# Patient Record
Sex: Male | Born: 1960 | Race: White | Hispanic: No | Marital: Married | Smoking: Never smoker
Health system: Southern US, Community
[De-identification: ages and names within clinical notes are randomized; demographics above are authoritative.]

## PROBLEM LIST (undated history)

## (undated) DIAGNOSIS — E119 Type 2 diabetes mellitus without complications: Secondary | ICD-10-CM

## (undated) DIAGNOSIS — E785 Hyperlipidemia, unspecified: Secondary | ICD-10-CM

## (undated) DIAGNOSIS — I878 Other specified disorders of veins: Secondary | ICD-10-CM

## (undated) DIAGNOSIS — I4821 Permanent atrial fibrillation: Secondary | ICD-10-CM

## (undated) DIAGNOSIS — G4733 Obstructive sleep apnea (adult) (pediatric): Secondary | ICD-10-CM

## (undated) DIAGNOSIS — I10 Essential (primary) hypertension: Secondary | ICD-10-CM

## (undated) HISTORY — DX: Essential (primary) hypertension: I10

## (undated) HISTORY — DX: Other specified disorders of veins: I87.8

## (undated) HISTORY — DX: Hyperlipidemia, unspecified: E78.5

## (undated) HISTORY — DX: Obstructive sleep apnea (adult) (pediatric): G47.33

## (undated) HISTORY — DX: Morbid (severe) obesity due to excess calories: E66.01

## (undated) HISTORY — DX: Permanent atrial fibrillation: I48.21

## (undated) HISTORY — DX: Type 2 diabetes mellitus without complications: E11.9

---

## 2008-08-20 ENCOUNTER — Ambulatory Visit: Payer: Self-pay | Admitting: Diagnostic Radiology

## 2008-08-20 ENCOUNTER — Ambulatory Visit (HOSPITAL_BASED_OUTPATIENT_CLINIC_OR_DEPARTMENT_OTHER): Admission: RE | Admit: 2008-08-20 | Discharge: 2008-08-20 | Payer: Self-pay | Admitting: Family Medicine
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
## 2012-10-24 ENCOUNTER — Other Ambulatory Visit: Payer: Self-pay

## 2012-10-24 MED ORDER — METOLAZONE 2.5 MG PO TABS: 2.5000 mg | ORAL_TABLET | ORAL | Status: DC

## 2012-10-24 NOTE — Telephone Encounter (Signed)
Rx was sent to pharmacy electronically via [employer].  

## 2012-11-20 ENCOUNTER — Other Ambulatory Visit: Payer: Self-pay | Admitting: *Deleted

## 2012-11-20 NOTE — Telephone Encounter (Signed)
Rx was sent to pharmacy electronically. [phone], for Metolazone 2.5mg  once weekly and Crestor 10mg  daily

## 2012-11-22 ENCOUNTER — Other Ambulatory Visit: Payer: Self-pay

## 2012-11-22 MED ORDER — METOPROLOL SUCCINATE ER 100 MG PO TB24: 100.0000 mg | ORAL_TABLET | Freq: Two times a day (BID) | ORAL | Status: DC

## 2012-11-22 NOTE — Telephone Encounter (Signed)
Rx was sent to pharmacy electronically via [employer].  

## 2013-01-10 ENCOUNTER — Other Ambulatory Visit: Payer: Self-pay | Admitting: *Deleted

## 2013-02-12 ENCOUNTER — Encounter: Payer: Self-pay | Admitting: Cardiovascular Disease

## 2013-02-12 ENCOUNTER — Ambulatory Visit (INDEPENDENT_AMBULATORY_CARE_PROVIDER_SITE_OTHER): Payer: BC Managed Care – PPO | Admitting: Cardiovascular Disease

## 2013-02-12 VITALS — BP 160/92 | HR 69 | Ht 73.0 in | Wt 348.8 lb

## 2013-02-12 DIAGNOSIS — I4891 Unspecified atrial fibrillation: Secondary | ICD-10-CM

## 2013-02-12 DIAGNOSIS — I872 Venous insufficiency (chronic) (peripheral): Secondary | ICD-10-CM

## 2013-02-12 DIAGNOSIS — I878 Other specified disorders of veins: Secondary | ICD-10-CM

## 2013-02-12 DIAGNOSIS — E785 Hyperlipidemia, unspecified: Secondary | ICD-10-CM | POA: Insufficient documentation

## 2013-02-12 DIAGNOSIS — E119 Type 2 diabetes mellitus without complications: Secondary | ICD-10-CM

## 2013-02-12 DIAGNOSIS — I10 Essential (primary) hypertension: Secondary | ICD-10-CM

## 2013-02-12 DIAGNOSIS — I4821 Permanent atrial fibrillation: Secondary | ICD-10-CM

## 2013-02-12 DIAGNOSIS — G4733 Obstructive sleep apnea (adult) (pediatric): Secondary | ICD-10-CM

## 2013-02-12 NOTE — Assessment & Plan Note (Signed)
Status post nasal palatal pharyngoplasty (NPP) by Dr. Janae Bridgeman at [facility]. He no longer has symptoms

## 2013-02-12 NOTE — Patient Instructions (Signed)
Your physician wants you to follow-up in: 6 months with an extender and 1 year with Dr Berry. You will receive a reminder letter in the mail two months in advance. If you don't receive a letter, please call our office to schedule the follow-up appointment.  

## 2013-02-12 NOTE — Assessment & Plan Note (Signed)
Rate controlled on oral anticoagulation 

## 2013-02-12 NOTE — Progress Notes (Signed)
02/12/2013 Isaiah Hinton   [birth year]  [id number]  Primary Physician Joycelyn Rua, MD Primary Cardiologist: Runell Gess MD Roseanne Reno   HPI:  Isaiah Hinton is a 52 year old morbidly overweight married Caucasian male father of 2 children he works as a [employer]. He was formally a patient of Dr. Rocco Serene. His past medical history is remarkable for persistent atrial fibrillation rate controlled on oral anticoagulation. He has type 2 diabetes, 2 hypertension and hyperlipidemia. He's had a normal cardiac catheterization performed in 1997 by Dr. Alanda Amass. He denies chest pain but does get dyspnea on exertion. He's also had obstructive sleep apnea and has undergone surgical curative therapy.   Current Outpatient Prescriptions  Medication Sig Dispense Refill  . dabigatran (PRADAXA) 150 MG CAPS capsule Take 150 mg by mouth every 12 (twelve) hours.      . digoxin (LANOXIN) 0.25 MG tablet Take 0.25 mg by mouth daily.      Marland Kitchen diltiazem (DILACOR XR) 240 MG 24 hr capsule Take 240 mg by mouth daily.      . furosemide (LASIX) 40 MG tablet Take 40 mg by mouth. 1 1/2 daily      . metFORMIN (GLUCOPHAGE) 1000 MG tablet Take 1,000 mg by mouth 2 (two) times daily with a meal.      . metolazone (ZAROXOLYN) 2.5 MG tablet Take 1 tablet (2.5 mg total) by mouth once a week.  12 tablet  2  . metoprolol succinate (TOPROL-XL) 100 MG 24 hr tablet Take 1 tablet (100 mg total) by mouth 2 (two) times daily.  60 tablet  6  . potassium chloride (K-DUR,KLOR-CON) 10 MEQ tablet Take 10 mEq by mouth 2 (two) times daily.      . rosuvastatin (CRESTOR) 10 MG tablet Take 10 mg by mouth daily.      Marland Kitchen spironolactone (ALDACTONE) 25 MG tablet Take 25 mg by mouth daily.      . valsartan (DIOVAN) 160 MG tablet Take 160 mg by mouth daily.       No current facility-administered medications for this visit.    Allergies  Allergen Reactions  . Shellfish Allergy Rash    History   Social History  .  Marital Status: Married    Spouse Name: N/A    Number of Children: N/A  . Years of Education: N/A   Occupational History  . Not on file.   Social History Main Topics  . Smoking status: Never Smoker   . Smokeless tobacco: Not on file  . Alcohol Use: No  . Drug Use: No  . Sexual Activity: Not on file   Other Topics Concern  . Not on file   Social History Narrative  . No narrative on file     Review of Systems: General: negative for chills, fever, night sweats or weight changes.  Cardiovascular: negative for chest pain, dyspnea on exertion, edema, orthopnea, palpitations, paroxysmal nocturnal dyspnea or shortness of breath Dermatological: negative for rash Respiratory: negative for cough or wheezing Urologic: negative for hematuria Abdominal: negative for nausea, vomiting, diarrhea, bright red blood per rectum, melena, or hematemesis Neurologic: negative for visual changes, syncope, or dizziness All other systems reviewed and are otherwise negative except as noted above.    Blood pressure 160/92, pulse 69, height 6\' 1"  (1.854 m), weight 348 lb 12.8 oz (158.215 kg).  General appearance: alert and no distress Neck: no adenopathy, no carotid bruit, no JVD, supple, symmetrical, trachea midline and thyroid not enlarged, symmetric, no tenderness/mass/nodules  Lungs: clear to auscultation bilaterally Heart: regular rate and rhythm, S1, S2 normal, no murmur, click, rub or gallop Extremities: 2+ edema with venous stasis changes  EKG atrial fibrillation with a ventricular response of 69 and low voltage  ASSESSMENT AND PLAN:   Permanent atrial fibrillation Rate controlled on oral anticoagulation  Essential hypertension Borderline controlled on current medications but he has not taken his blood pressure medicines at this morning  Hyperlipidemia On statin therapy followed by his PCP  Obstructive sleep apnea Status post nasal palatal pharyngoplasty (NPP) by Dr. Janae Bridgeman at [facility]. He  no longer has symptoms  Venous stasis He clearly has venous stasis changes with chronic edema and discoloration      Runell Gess MD [facility], [facility] 02/12/2013 10:10 AM

## 2013-02-12 NOTE — Assessment & Plan Note (Signed)
On statin therapy followed by his PCP 

## 2013-02-12 NOTE — Assessment & Plan Note (Signed)
Borderline controlled on current medications but he has not taken his blood pressure medicines at this morning

## 2013-02-12 NOTE — Assessment & Plan Note (Signed)
He clearly has venous stasis changes with chronic edema and discoloration

## 2013-02-13 ENCOUNTER — Encounter: Payer: Self-pay | Admitting: Cardiovascular Disease

## 2013-02-23 ENCOUNTER — Telehealth: Payer: Self-pay | Admitting: Cardiovascular Disease

## 2013-02-23 MED ORDER — FUROSEMIDE 40 MG PO TABS: 60.0000 mg | ORAL_TABLET | Freq: Every day | ORAL | Status: DC

## 2013-02-23 NOTE — Telephone Encounter (Signed)
Please call refill on Lasix 40 mg # 45  To [facility] [address]

## 2013-03-28 ENCOUNTER — Other Ambulatory Visit: Payer: Self-pay | Admitting: Cardiovascular Disease

## 2013-03-28 NOTE — Telephone Encounter (Signed)
Rx for Furosemide refused - this medicine has already been sent in to the pharmacy.  Please advise rx request for Viagra.

## 2013-04-05 ENCOUNTER — Other Ambulatory Visit: Payer: Self-pay | Admitting: Cardiovascular Disease

## 2013-04-05 ENCOUNTER — Telehealth: Payer: Self-pay | Admitting: Cardiovascular Disease

## 2013-04-05 NOTE — Telephone Encounter (Signed)
Rx was sent to pharmacy electronically. 

## 2013-04-05 NOTE — Telephone Encounter (Signed)
Need a new prescription for his Furosemide 40 mg # 45

## 2013-04-05 NOTE — Telephone Encounter (Signed)
Prescription for Furosemide 40mg  1.5 tablets QD already sent into pharmacy.

## 2013-04-16 ENCOUNTER — Other Ambulatory Visit: Payer: Self-pay | Admitting: Pharmacist Clinician (PhC)/ Clinical Pharmacy Specialist

## 2013-04-16 MED ORDER — DABIGATRAN ETEXILATE MESYLATE 150 MG PO CAPS: 150.0000 mg | ORAL_CAPSULE | Freq: Two times a day (BID) | ORAL | Status: AC

## 2013-08-13 ENCOUNTER — Other Ambulatory Visit: Payer: Self-pay | Admitting: *Deleted

## 2013-08-13 MED ORDER — ROSUVASTATIN CALCIUM 10 MG PO TABS: 10.0000 mg | ORAL_TABLET | Freq: Every day | ORAL | Status: AC

## 2013-08-13 MED ORDER — METOPROLOL SUCCINATE ER 100 MG PO TB24: 100.0000 mg | ORAL_TABLET | Freq: Two times a day (BID) | ORAL | Status: DC

## 2013-08-13 NOTE — Telephone Encounter (Signed)
Refilled patient's metoprolol and crestor to [facility] [address].

## 2013-08-16 ENCOUNTER — Other Ambulatory Visit: Payer: Self-pay

## 2013-08-16 MED ORDER — METOPROLOL SUCCINATE ER 100 MG PO TB24: 100.0000 mg | ORAL_TABLET | Freq: Two times a day (BID) | ORAL | Status: AC

## 2013-08-16 NOTE — Telephone Encounter (Signed)
Rx was sent to pharmacy electronically. 

## 2013-08-21 ENCOUNTER — Other Ambulatory Visit: Payer: Self-pay | Admitting: *Deleted

## 2013-08-21 MED ORDER — METOLAZONE 2.5 MG PO TABS: 2.5000 mg | ORAL_TABLET | ORAL | Status: AC

## 2013-08-21 NOTE — Telephone Encounter (Signed)
Rx refill sent to patient pharmacy   

## 2013-08-22 ENCOUNTER — Other Ambulatory Visit: Payer: Self-pay | Admitting: *Deleted

## 2013-08-22 NOTE — Telephone Encounter (Signed)
Rx refill was just filled.

## 2013-08-28 ENCOUNTER — Other Ambulatory Visit: Payer: Self-pay | Admitting: *Deleted

## 2013-08-28 MED ORDER — SILDENAFIL CITRATE 100 MG PO TABS: ORAL_TABLET | ORAL | Status: AC

## 2013-08-28 NOTE — Telephone Encounter (Signed)
Refill phoned into pharmacy. 

## 2013-09-07 ENCOUNTER — Telehealth: Payer: Self-pay | Admitting: Cardiovascular Disease

## 2013-09-07 NOTE — Telephone Encounter (Signed)
[name] passed away on 08-19-22 09-06-13.

## 2013-09-11 ENCOUNTER — Other Ambulatory Visit: Payer: Self-pay | Admitting: *Deleted

## 2013-09-11 MED ORDER — DIGOXIN 250 MCG PO TABS: 0.2500 mg | ORAL_TABLET | Freq: Every day | ORAL | Status: AC

## 2013-09-11 MED ORDER — VALSARTAN 160 MG PO TABS: 160.0000 mg | ORAL_TABLET | Freq: Every day | ORAL | Status: AC

## 2013-09-11 MED ORDER — DILTIAZEM HCL ER 240 MG PO CP24: 240.0000 mg | ORAL_CAPSULE | Freq: Every day | ORAL | Status: AC

## 2013-09-11 NOTE — Telephone Encounter (Signed)
Rx refill sent to patient pharmacy   

## 2013-09-17 DEATH — deceased
